# Patient Record
Sex: Male | Born: 2000 | Race: White | Hispanic: No | Marital: Single | Smoking: Never smoker
Health system: Southern US, Community
[De-identification: ages and names within clinical notes are randomized; demographics above are authoritative.]

## PROBLEM LIST (undated history)

## (undated) DIAGNOSIS — E049 Nontoxic goiter, unspecified: Secondary | ICD-10-CM

## (undated) DIAGNOSIS — L309 Dermatitis, unspecified: Secondary | ICD-10-CM

## (undated) DIAGNOSIS — R630 Anorexia: Secondary | ICD-10-CM

## (undated) DIAGNOSIS — R6250 Unspecified lack of expected normal physiological development in childhood: Secondary | ICD-10-CM

## (undated) DIAGNOSIS — I780 Hereditary hemorrhagic telangiectasia: Secondary | ICD-10-CM

## (undated) DIAGNOSIS — E063 Autoimmune thyroiditis: Secondary | ICD-10-CM

## (undated) DIAGNOSIS — F419 Anxiety disorder, unspecified: Secondary | ICD-10-CM

## (undated) DIAGNOSIS — T7840XA Allergy, unspecified, initial encounter: Secondary | ICD-10-CM

## (undated) HISTORY — DX: Unspecified lack of expected normal physiological development in childhood: R62.50

## (undated) HISTORY — DX: Allergy, unspecified, initial encounter: T78.40XA

## (undated) HISTORY — DX: Dermatitis, unspecified: L30.9

## (undated) HISTORY — DX: Nontoxic goiter, unspecified: E04.9

## (undated) HISTORY — DX: Anxiety disorder, unspecified: F41.9

## (undated) HISTORY — DX: Hereditary hemorrhagic telangiectasia: I78.0

## (undated) HISTORY — PX: FOREARM SURGERY: SHX651

## (undated) HISTORY — DX: Anorexia: R63.0

## (undated) HISTORY — DX: Autoimmune thyroiditis: E06.3

## (undated) HISTORY — PX: CLAVICLE SURGERY: SHX598

---
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
## 2019-02-20 DIAGNOSIS — R05 Cough: Secondary | ICD-10-CM | POA: Diagnosis not present

## 2019-02-21 DIAGNOSIS — R05 Cough: Secondary | ICD-10-CM | POA: Diagnosis not present
